# Patient Record
Sex: Female | Born: 2005 | Race: Black or African American | Hispanic: No | Marital: Single | Smoking: Never smoker
Health system: Southern US, Community
[De-identification: ages and names within clinical notes are randomized; demographics above are authoritative.]

## PROBLEM LIST (undated history)

## (undated) ENCOUNTER — Emergency Department (HOSPITAL_BASED_OUTPATIENT_CLINIC_OR_DEPARTMENT_OTHER): Admission: EM | Payer: Managed Care, Other (non HMO) | Source: Home / Self Care

## (undated) DIAGNOSIS — Z8619 Personal history of other infectious and parasitic diseases: Secondary | ICD-10-CM

## (undated) DIAGNOSIS — E301 Precocious puberty: Secondary | ICD-10-CM

## (undated) DIAGNOSIS — J45909 Unspecified asthma, uncomplicated: Secondary | ICD-10-CM

## (undated) HISTORY — DX: Precocious puberty: E30.1

## (undated) HISTORY — DX: Personal history of other infectious and parasitic diseases: Z86.19

## (undated) HISTORY — PX: TONSILECTOMY, ADENOIDECTOMY, BILATERAL MYRINGOTOMY AND TUBES: SHX2538

## (undated) HISTORY — DX: Unspecified asthma, uncomplicated: J45.909

## (undated) HISTORY — PX: SUPPRELIN IMPLANT: SHX5166

---
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
## 2023-06-28 ENCOUNTER — Encounter: Payer: 59 | Admitting: Family

## 2023-06-28 NOTE — Progress Notes (Signed)
 Erroneous encounter-disregard

## 2023-08-14 ENCOUNTER — Ambulatory Visit (INDEPENDENT_AMBULATORY_CARE_PROVIDER_SITE_OTHER): Admitting: Family

## 2023-08-14 ENCOUNTER — Encounter: Payer: Self-pay | Admitting: Family

## 2023-08-14 VITALS — BP 116/71 | HR 72 | Temp 98.1°F | Resp 16 | Ht 65.0 in | Wt 174.0 lb

## 2023-08-14 DIAGNOSIS — Z131 Encounter for screening for diabetes mellitus: Secondary | ICD-10-CM | POA: Diagnosis not present

## 2023-08-14 DIAGNOSIS — Z0000 Encounter for general adult medical examination without abnormal findings: Secondary | ICD-10-CM

## 2023-08-14 DIAGNOSIS — Z13228 Encounter for screening for other metabolic disorders: Secondary | ICD-10-CM

## 2023-08-14 DIAGNOSIS — Z130 Encounter for screening for diseases of the blood and blood-forming organs and certain disorders involving the immune mechanism: Secondary | ICD-10-CM | POA: Diagnosis not present

## 2023-08-14 DIAGNOSIS — Z1329 Encounter for screening for other suspected endocrine disorder: Secondary | ICD-10-CM

## 2023-08-14 DIAGNOSIS — Z13220 Encounter for screening for lipoid disorders: Secondary | ICD-10-CM

## 2023-08-14 DIAGNOSIS — Z1321 Encounter for screening for nutritional disorder: Secondary | ICD-10-CM

## 2023-08-14 NOTE — Progress Notes (Signed)
 Patient wants her vitamin D level check and her iron, and protein levels checked

## 2023-08-14 NOTE — Progress Notes (Signed)
 Patient ID: Lynn Cook, female    DOB: 29-Mar-2006  MRN: [id number]  CC: Annual Exam   Subjective: Lynn Cook is a 18 y.o. female who presents for annual exam.   Her concerns today include:  - Requests Vitamin D and iron labs. States she donates plasma and proteins are always low. She would like to have protein lab checked. States she doesn't know exactly which protein is low. Patient to schedule lab only appointment to have protein lab collected when she finds out which protein is low per plasma center.  Patient Active Problem List   Diagnosis Date Noted   Vitamin D deficiency 02/25/2021   Neck fullness 06/10/2014   Obesity 06/10/2014   Localized swelling, mass or lump of neck 06/10/2014   Dyspepsia 04/22/2014   Prediabetes 04/22/2014   Dermatitis 04/22/2014   Borderline hypertension 12/30/2013   Precocious puberty 12/26/2012   Advanced bone age 66/20/2014   Acanthosis nigricans 12/26/2012   Sleep concern 12/26/2012   Other specified disorders of bone density and structure, unspecified site 12/26/2012     Current Outpatient Medications on File Prior to Visit  Medication Sig Dispense Refill   ELDERBERRY PO Take by mouth. (Patient not taking: Reported on 08/14/2023)     MELATONIN PO Take by mouth. (Patient not taking: Reported on 08/14/2023)     naproxen (NAPROSYN) 375 MG tablet Take 1 tablet (375 mg total) by mouth 2 (two) times daily. 20 tablet 0   No current facility-administered medications on file prior to visit.    No Known Allergies  Social History   Socioeconomic History   Marital status: Single    Spouse name: Not on file   Number of children: Not on file   Years of education: Not on file   Highest education level: Not on file  Occupational History   Not on file  Tobacco Use   Smoking status: Never    Passive exposure: Never   Smokeless tobacco: Never  Vaping Use   Vaping status: Never Used  Substance and Sexual Activity   Alcohol use: No   Drug  use: Never   Sexual activity: Not Currently  Other Topics Concern   Not on file  Social History Narrative   Lives with mom,aunt,sister.    Social Drivers of Corporate investment banker Strain: Low Risk  (08/14/2023)   Overall Financial Resource Strain (CARDIA)    Difficulty of Paying Living Expenses: Not hard at all  Food Insecurity: No Food Insecurity (08/14/2023)   Hunger Vital Sign    Worried About Running Out of Food in the Last Year: Never true    Ran Out of Food in the Last Year: Never true  Transportation Needs: No Transportation Needs (08/14/2023)   PRAPARE - Administrator, Civil Service (Medical): No    Lack of Transportation (Non-Medical): No  Physical Activity: Insufficiently Active (08/14/2023)   Exercise Vital Sign    Days of Exercise per Week: 3 days    Minutes of Exercise per Session: 30 min  Stress: No Stress Concern Present (08/14/2023)   [employer] of Occupational Health - Occupational Stress Questionnaire    Feeling of Stress : Not at all  Social Connections: Moderately Integrated (08/14/2023)   Social Connection and Isolation Panel [NHANES]    Frequency of Communication with Friends and Family: More than three times a week    Frequency of Social Gatherings with Friends and Family: More than three times a week  Attends Religious Services: More than 4 times per year    Active Member of Clubs or Organizations: Yes    Attends Banker Meetings: More than 4 times per year    Marital Status: Never married  Intimate Partner Violence: Not At Risk (08/14/2023)   Humiliation, Afraid, Rape, and Kick questionnaire    Fear of Current or Ex-Partner: No    Emotionally Abused: No    Physically Abused: No    Sexually Abused: No    Family History  Problem Relation Age of Onset   Obesity Mother    Hypertension Mother    Diabetes Mother    Polycystic ovary syndrome Mother    Kidney disease Father    Hypertension Paternal Grandfather     Past  Surgical History:  Procedure Laterality Date   SUPPRELIN IMPLANT     02/26/2010, 03/25/11, 10/2012   TONSILECTOMY, ADENOIDECTOMY, BILATERAL MYRINGOTOMY AND TUBES     age 1    ROS: Review of Systems Negative except as stated above  PHYSICAL EXAM: BP 116/71   Pulse 72   Temp 98.1 F (36.7 C) (Oral)   Resp 16   Ht 5\' 5"  (1.651 m)   Wt 174 lb (78.9 kg)   LMP 08/04/2023 (Exact Date)   SpO2 100%   BMI 28.96 kg/m   Physical Exam HENT:     Head: Normocephalic and atraumatic.     Right Ear: Tympanic membrane, ear canal and external ear normal.     Left Ear: Tympanic membrane, ear canal and external ear normal.     Nose: Nose normal.     Mouth/Throat:     Mouth: Mucous membranes are moist.     Pharynx: Oropharynx is clear.  Eyes:     Extraocular Movements: Extraocular movements intact.     Conjunctiva/sclera: Conjunctivae normal.     Pupils: Pupils are equal, round, and reactive to light.  Neck:     Thyroid: No thyroid mass, thyromegaly or thyroid tenderness.  Cardiovascular:     Rate and Rhythm: Normal rate and regular rhythm.     Pulses: Normal pulses.     Heart sounds: Normal heart sounds.  Pulmonary:     Effort: Pulmonary effort is normal.     Breath sounds: Normal breath sounds.  Chest:     Comments: Patient declined. Abdominal:     General: Bowel sounds are normal.     Palpations: Abdomen is soft.  Genitourinary:    Comments: Patient declined. Musculoskeletal:        General: Normal range of motion.     Right shoulder: Normal.     Left shoulder: Normal.     Right upper arm: Normal.     Left upper arm: Normal.     Right elbow: Normal.     Left elbow: Normal.     Right forearm: Normal.     Left forearm: Normal.     Right wrist: Normal.     Left wrist: Normal.     Right hand: Normal.     Left hand: Normal.     Cervical back: Normal, normal range of motion and neck supple.     Thoracic back: Normal.     Lumbar back: Normal.     Right hip: Normal.      Left hip: Normal.     Right upper leg: Normal.     Left upper leg: Normal.     Right knee: Normal.     Left knee: Normal.  Right lower leg: Normal.     Left lower leg: Normal.     Right ankle: Normal.     Left ankle: Normal.     Right foot: Normal.     Left foot: Normal.  Skin:    General: Skin is warm and dry.     Capillary Refill: Capillary refill takes less than 2 seconds.  Neurological:     General: No focal deficit present.     Mental Status: She is alert and oriented to person, place, and time.  Psychiatric:        Mood and Affect: Mood normal.        Behavior: Behavior normal.     ASSESSMENT AND PLAN: 1. Annual physical exam (Primary) - Counseled on 150 minutes of exercise per week as tolerated, healthy eating (including decreased daily intake of saturated fats, cholesterol, added sugars, sodium), STI prevention, and routine healthcare maintenance.  2. Screening for metabolic disorder - Routine screening.  - CMP14+EGFR  3. Screening for deficiency anemia - Routine screening.  - CBC - Iron, TIBC and Ferritin Panel  4. Diabetes mellitus screening - Routine screening.  - Hemoglobin A1c  5. Screening cholesterol level - Routine screening.  - Lipid panel  6. Thyroid disorder screen - Routine screening.  - TSH  7. Encounter for vitamin deficiency screening - Routine screening.  - Vitamin D, 25-hydroxy    Patient was given the opportunity to ask questions.  Patient verbalized understanding of the plan and was able to repeat key elements of the plan. Patient was given clear instructions to go to Emergency Department or return to medical center if symptoms don't improve, worsen, or new problems develop.The patient verbalized understanding.   Orders Placed This Encounter  Procedures   CBC   Lipid panel   CMP14+EGFR   Hemoglobin A1c   TSH   Vitamin D, 25-hydroxy   Iron, TIBC and Ferritin Panel    Return in about 1 year (around 08/13/2024) for Physical  per patient preference.  Rema Fendt, NP

## 2023-08-15 ENCOUNTER — Encounter: Payer: Self-pay | Admitting: Family

## 2023-08-15 ENCOUNTER — Other Ambulatory Visit: Payer: Self-pay | Admitting: Family

## 2023-08-15 DIAGNOSIS — D509 Iron deficiency anemia, unspecified: Secondary | ICD-10-CM

## 2023-08-15 DIAGNOSIS — E611 Iron deficiency: Secondary | ICD-10-CM

## 2023-08-15 DIAGNOSIS — E559 Vitamin D deficiency, unspecified: Secondary | ICD-10-CM

## 2023-08-15 DIAGNOSIS — Z13220 Encounter for screening for lipoid disorders: Secondary | ICD-10-CM

## 2023-08-15 LAB — CMP14+EGFR
ALT: 22 IU/L (ref 0–32)
AST: 23 IU/L (ref 0–40)
Albumin: 3.9 g/dL — ABNORMAL LOW (ref 4.0–5.0)
Alkaline Phosphatase: 42 IU/L (ref 42–106)
BUN/Creatinine Ratio: 16 (ref 9–23)
BUN: 11 mg/dL (ref 6–20)
Bilirubin Total: 0.4 mg/dL (ref 0.0–1.2)
CO2: 23 mmol/L (ref 20–29)
Calcium: 9.1 mg/dL (ref 8.7–10.2)
Chloride: 103 mmol/L (ref 96–106)
Creatinine, Ser: 0.69 mg/dL (ref 0.57–1.00)
Globulin, Total: 1.8 g/dL (ref 1.5–4.5)
Glucose: 66 mg/dL — ABNORMAL LOW (ref 70–99)
Potassium: 4.5 mmol/L (ref 3.5–5.2)
Sodium: 143 mmol/L (ref 134–144)
Total Protein: 5.7 g/dL — ABNORMAL LOW (ref 6.0–8.5)
eGFR: 128 mL/min/{1.73_m2} (ref >59)

## 2023-08-15 LAB — IRON,TIBC AND FERRITIN PANEL
Ferritin: 14 ng/mL — ABNORMAL LOW (ref 15–77)
Iron Saturation: 24 % (ref 15–55)
Iron: 75 ug/dL (ref 27–159)
Total Iron Binding Capacity: 309 ug/dL (ref 250–450)
UIBC: 234 ug/dL (ref 131–425)

## 2023-08-15 LAB — CBC
Hematocrit: 37.1 % (ref 34.0–46.6)
Hemoglobin: 11.6 g/dL (ref 11.1–15.9)
MCH: 24.4 pg — ABNORMAL LOW (ref 26.6–33.0)
MCHC: 31.3 g/dL — ABNORMAL LOW (ref 31.5–35.7)
MCV: 78 fL — ABNORMAL LOW (ref 79–97)
Platelets: 327 10*3/uL (ref 150–450)
RBC: 4.75 x10E6/uL (ref 3.77–5.28)
RDW: 15.7 % — ABNORMAL HIGH (ref 11.7–15.4)
WBC: 3.5 10*3/uL (ref 3.4–10.8)

## 2023-08-15 LAB — LIPID PANEL
Chol/HDL Ratio: 3.9 ratio (ref 0.0–4.4)
Cholesterol, Total: 212 mg/dL — ABNORMAL HIGH (ref 100–169)
HDL: 55 mg/dL (ref >39)
LDL Chol Calc (NIH): 144 mg/dL — ABNORMAL HIGH (ref 0–109)
Triglycerides: 73 mg/dL (ref 0–89)
VLDL Cholesterol Cal: 13 mg/dL (ref 5–40)

## 2023-08-15 LAB — HEMOGLOBIN A1C
Est. average glucose Bld gHb Est-mCnc: 97 mg/dL
Hgb A1c MFr Bld: 5 % (ref 4.8–5.6)

## 2023-08-15 LAB — TSH: TSH: 1.29 u[IU]/mL (ref 0.450–4.500)

## 2023-08-15 LAB — VITAMIN D 25 HYDROXY (VIT D DEFICIENCY, FRACTURES): Vit D, 25-Hydroxy: 23.9 ng/mL — ABNORMAL LOW (ref 30.0–100.0)

## 2023-08-15 MED ORDER — VITAMIN D (ERGOCALCIFEROL) 1.25 MG (50000 UNIT) PO CAPS: 50000.0000 [IU] | ORAL_CAPSULE | ORAL | 0 refills | Status: AC

## 2023-08-28 ENCOUNTER — Telehealth: Payer: Self-pay

## 2023-08-28 NOTE — Telephone Encounter (Signed)
 Copied from CRM [id number]. Topic: Referral - Status >> Aug 24, 2023  2:07 PM Lotus [place] B wrote: Reason for CRM: at last appointment pt was told that she would receive a call Referral to Hematology Oncology for evaluation/management of anemia and low iron and has yet to receive a call .     Resolved: Called patient and voicemail was left. Also information sent through mychart as well

## 2023-09-26 ENCOUNTER — Inpatient Hospital Stay: Attending: Hematology | Admitting: Hematology

## 2023-09-26 ENCOUNTER — Encounter: Payer: Self-pay | Admitting: Hematology

## 2023-09-26 ENCOUNTER — Inpatient Hospital Stay

## 2023-09-26 VITALS — BP 128/72 | HR 79 | Temp 98.1°F | Resp 20 | Ht 65.0 in | Wt 176.1 lb

## 2023-09-26 DIAGNOSIS — D500 Iron deficiency anemia secondary to blood loss (chronic): Secondary | ICD-10-CM

## 2023-09-26 DIAGNOSIS — D509 Iron deficiency anemia, unspecified: Secondary | ICD-10-CM | POA: Insufficient documentation

## 2023-09-26 NOTE — Progress Notes (Signed)
 [facility]   [phone] [phone]) [phone]   Clinic New Consult Note   Patient Care Team: Senaida Dama, NP as PCP - General (Nurse Practitioner) 09/26/2023  CHIEF COMPLAINTS/PURPOSE OF CONSULTATION:  Iron deficient anemia  REFERRING PHYSICIAN: Senaida Dama, NP   Discussed the use of AI scribe software for clinical note transcription with the patient, who gave verbal consent to proceed.  History of Present Illness Lynn Cook is a 18 year old female who presents for a new consult regarding iron deficient anemia. She is accompanied by her mother. She was referred by her primary care physician for evaluation of iron deficiency anemia.  She has iron deficiency anemia identified through routine blood tests on 08/14/2023 , with hemoglobin at 11.6 g/dL, low MCV, and ferritin at 14 ng/mL. Her menstrual cycles are regular, lasting five to seven days with heavy flow initially, requiring sanitary product changes every two to three hours. She does not take iron supplements but uses multivitamins.  There is no history of anemia in childhood, and she denies symptoms such as blood in stool, urine, unusual bleeding, shortness of breath, or fatigue with physical activity. 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  Family history includes an older sister with low white blood cell counts in childhood, but no anemia, sickle cell disease, or thalassemia. She is a Consulting civil engineer, does not smoke or drink alcohol, and has no history of pregnancy or children.     MEDICAL HISTORY:  Past Medical History:  Diagnosis Date   Allergy-induced asthma    History of RSV infection    Precocious puberty    diagnosis made at age 89     SURGICAL HISTORY: Past Surgical History:  Procedure Laterality Date   SUPPRELIN IMPLANT     02/26/2010, 03/25/11, 10/2012   TONSILECTOMY,  ADENOIDECTOMY, BILATERAL MYRINGOTOMY AND TUBES     age 39    SOCIAL HISTORY: Social History   Socioeconomic History   Marital status: Single    Spouse name: Not on file   Number of children: 0   Years of education: Not on file   Highest education level: Not on file  Occupational History   Not on file  Tobacco Use   Smoking status: Never    Passive exposure: Never   Smokeless tobacco: Never  Vaping Use   Vaping status: Never Used  Substance and Sexual Activity   Alcohol use: No   Drug use: Never   Sexual activity: Not Currently  Other Topics Concern   Not on file  Social History Narrative   Lives with mom,aunt,sister.    Social Drivers of Corporate investment banker Strain: Low Risk  (08/14/2023)   Overall Financial Resource Strain (CARDIA)    Difficulty of Paying Living Expenses: Not hard at all  Food Insecurity: No Food Insecurity (09/26/2023)   Hunger Vital Sign    Worried About Running Out of Food in the Last Year: Never true    Ran Out of Food in the Last Year: Never true  Transportation Needs: No Transportation Needs (09/26/2023)   PRAPARE - Administrator, Civil Service (Medical): No    Lack of Transportation (Non-Medical): No  Physical Activity: Insufficiently Active (08/14/2023)   Exercise Vital Sign    Days of Exercise per Week: 3 days    Minutes of Exercise per Session: 30 min  Stress: No Stress Concern Present (08/14/2023)   [place]  [employer] - Occupational Stress Questionnaire    Feeling of Stress : Not at all  Social Connections: Moderately Integrated (08/14/2023)   Social Connection and Isolation Panel [NHANES]    Frequency of Communication with Friends and Family: More than three times a week    Frequency of Social Gatherings with Friends and Family: More than three times a week    Attends Religious Services: More than 4 times per year    Active Member of [employer] or Organizations: Yes    Attends Engineer, structural:  More than 4 times per year    Marital Status: Never married  Intimate Partner Violence: Not At Risk (09/26/2023)   Humiliation, Afraid, Rape, and Kick questionnaire    Fear of Current or Ex-Partner: No    Emotionally Abused: No    Physically Abused: No    Sexually Abused: No    FAMILY HISTORY: Family History  Problem Relation Age of Onset   Obesity Mother    Hypertension Mother    Diabetes Mother    Polycystic ovary syndrome Mother    Kidney disease Father    Hypertension Paternal Grandfather     ALLERGIES:  has no known allergies.  MEDICATIONS:  Current Outpatient Medications  Medication Sig Dispense Refill   magnesium (MAGTAB) 84 MG ( ) TBCR SR tablet Take 84 mg by mouth.     Multiple Vitamin (MULTIVITAMIN) tablet Take 1 tablet by mouth daily.     Vitamin D , Ergocalciferol , (DRISDOL ) 1.25 MG (50000 UNIT) CAPS capsule Take 1 capsule (50,000 Units total) by mouth every 7 (seven) days for 12 doses. 12 capsule 0   ELDERBERRY PO Take by mouth. (Patient not taking: Reported on 09/26/2023)     MELATONIN PO Take by mouth. (Patient not taking: Reported on 09/26/2023)     naproxen  (NAPROSYN ) 375 MG tablet Take 1 tablet (375 mg total) by mouth 2 (two) times daily. 20 tablet 0   No current facility-administered medications for this visit.    REVIEW OF SYSTEMS:   Constitutional: Denies fevers, chills or abnormal night sweats Eyes: Denies blurriness of vision, double vision or watery eyes Ears, nose, mouth, throat, and face: Denies mucositis or sore throat Respiratory: Denies cough, dyspnea or wheezes Cardiovascular: Denies palpitation, chest discomfort or lower extremity swelling Gastrointestinal:  Denies nausea, heartburn or change in bowel habits Skin: Denies abnormal skin rashes Lymphatics: Denies new lymphadenopathy or easy bruising Neurological:Denies numbness, tingling or new weaknesses Behavioral/Psych: Mood is stable, no new changes  All other systems were reviewed with  the patient and are negative.  PHYSICAL EXAMINATION: ECOG PERFORMANCE STATUS: 0 - Asymptomatic  Vitals:   09/26/23 1457  BP: 128/72  Pulse: 79  Resp: 20  Temp: 98.1 F (36.7 C)  SpO2: 98%   Filed Weights   09/26/23 1457  Weight: 176 lb 1.6 oz (79.9 kg)    GENERAL:alert, no distress and comfortable SKIN: skin color, texture, turgor are normal, no rashes or significant lesions EYES: normal, conjunctiva are pink and non-injected, sclera clear OROPHARYNX:no exudate, no erythema and lips, buccal mucosa, and tongue normal  NECK: supple, thyroid  normal size, non-tender, without nodularity LYMPH:  no palpable lymphadenopathy in the cervical, axillary or inguinal LUNGS: clear to auscultation and percussion with normal breathing effort HEART: regular rate & rhythm and no murmurs and no lower extremity edema ABDOMEN:abdomen soft, non-tender and normal bowel sounds Musculoskeletal:no cyanosis of digits and no clubbing  PSYCH: alert & oriented x 3 with fluent speech NEURO: no focal  motor/sensory deficits  Physical Exam    LABORATORY DATA:  I have reviewed the data as listed    Latest Ref Rng & Units 08/14/2023   10:19 AM 06/27/2022    9:03 PM 02/24/2021    4:59 PM  CBC  WBC 3.4 - 10.8 x10E3/uL 3.5  4.8  3.9   Hemoglobin 11.1 - 15.9 g/dL 16.1  09.6  04.5   Hematocrit 34.0 - 46.6 % 37.1  35.9  35.7   Platelets 150 - 450 x10E3/uL 327  291  252       Latest Ref Rng & Units 08/14/2023   10:19 AM 06/27/2022    9:03 PM 02/24/2021    4:59 PM  CMP  Glucose 70 - 99 mg/dL 66  82  88   BUN 6 - 20 mg/dL 11  14  13    Creatinine 0.57 - 1.00 mg/dL 4.09  8.11  9.14   Sodium 134 - 144 mmol/L 143  140  140   Potassium 3.5 - 5.2 mmol/L 4.5  4.4  4.2   Chloride 96 - 106 mmol/L 103  103  105   CO2 20 - 29 mmol/L 23  22  23    Calcium 8.7 - 10.2 mg/dL 9.1  9.5  9.4   Total Protein 6.0 - 8.5 g/dL 5.7  6.6  6.5   Total Bilirubin 0.0 - 1.2 mg/dL 0.4  0.3  0.2   Alkaline Phos 42 - 106 IU/L 42  73   54   AST 0 - 40 IU/L 23  24  15    ALT 0 - 32 IU/L 22  32  11      RADIOGRAPHIC STUDIES: I have personally reviewed the radiological images as listed and agreed with the findings in the report. No results found.   Assessment & Plan Iron deficiency anemia Mild iron deficiency anemia likely secondary to menstrual blood loss. Hemoglobin is 11.6 g/dL with a low MCV of 75. Ferritin is 14 ng/mL. No significant symptoms or family history of anemia.  Thalassemia is on the differential but if she has no family history.  -Oral iron supplementation is recommended as first-line treatment. Potential side effects include gastrointestinal upset and constipation, which may necessitate a change in iron formulation if severe. IV iron is considered if oral iron is not tolerated or if symptoms worsen. - Recommend over-the-counter ferrous sulfate 65 mg once or twice daily. - Advise taking iron with vitamin C or orange juice to enhance absorption. - Increase dietary intake of iron-rich foods, particularly red meat, chicken, and fish. - Schedule follow-up lab work in three months to reassess iron levels. - Consider IV iron if oral iron is not tolerated or if symptoms worsen. - Plan follow-up visit in six months after lab results to determine further management.  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 Treatment was successful, and normal menstruation began six months after cessation of therapy. No current issues related to precocious puberty.  Plan - Labs reviewed, mild iron deficiency - I recommend her to start oral ferrous sulfate once or twice daily - Follow-up CBC and ferritin every 3 months -phone visit in 6 months after lab   Orders Placed This Encounter  Procedures   CBC with Differential/Platelet    Standing Status:   Standing    Number of Occurrences:   50    Expiration Date:   09/25/2024   Ferritin    Standing Status:   Standing  Number of Occurrences:   20    Expiration Date:   09/25/2024    All questions were answered. The patient knows to call the clinic with any problems, questions or concerns. I spent 25 minutes counseling the patient face to face. The total time spent in the appointment was 30 minutes including review of chart and various tests results, discussions about plan of care and coordination of care plan.     Sonja [place], MD 09/26/2023

## 2023-12-29 ENCOUNTER — Inpatient Hospital Stay: Attending: Hematology

## 2023-12-29 DIAGNOSIS — D509 Iron deficiency anemia, unspecified: Secondary | ICD-10-CM | POA: Insufficient documentation

## 2023-12-29 DIAGNOSIS — D500 Iron deficiency anemia secondary to blood loss (chronic): Secondary | ICD-10-CM

## 2023-12-29 LAB — CBC WITH DIFFERENTIAL/PLATELET
Abs Immature Granulocytes: 0.01 K/uL (ref 0.00–0.07)
Basophils Absolute: 0 K/uL (ref 0.0–0.1)
Basophils Relative: 1 %
Eosinophils Absolute: 0.1 K/uL (ref 0.0–0.5)
Eosinophils Relative: 1 %
HCT: 35.4 % — ABNORMAL LOW (ref 36.0–46.0)
Hemoglobin: 11.2 g/dL — ABNORMAL LOW (ref 12.0–15.0)
Immature Granulocytes: 0 %
Lymphocytes Relative: 43 %
Lymphs Abs: 1.5 K/uL (ref 0.7–4.0)
MCH: 23.6 pg — ABNORMAL LOW (ref 26.0–34.0)
MCHC: 31.6 g/dL (ref 30.0–36.0)
MCV: 74.5 fL — ABNORMAL LOW (ref 80.0–100.0)
Monocytes Absolute: 0.3 K/uL (ref 0.1–1.0)
Monocytes Relative: 9 %
Neutro Abs: 1.6 K/uL — ABNORMAL LOW (ref 1.7–7.7)
Neutrophils Relative %: 46 %
Platelets: 286 K/uL (ref 150–400)
RBC: 4.75 MIL/uL (ref 3.87–5.11)
RDW: 14.9 % (ref 11.5–15.5)
WBC: 3.5 K/uL — ABNORMAL LOW (ref 4.0–10.5)
nRBC: 0 % (ref 0.0–0.2)

## 2023-12-29 LAB — FERRITIN: Ferritin: 19 ng/mL (ref 11–307)

## 2024-01-04 ENCOUNTER — Ambulatory Visit: Payer: Self-pay | Admitting: Nurse Practitioner

## 2024-01-05 NOTE — Telephone Encounter (Addendum)
 Called patient to relay message below as per Lacie Burton NP, patient voiced full understanding, she is taking one a day and is tolerating well, instruted patient to take 2 tablets a day an take with Orange Juice. She voiced full understanding and instructed her to call us  if she is having any issues.    ----- Message from Lacie K Burton sent at 01/04/2024 10:42 AM EDT ----- Please call pt to confirm if she is taking oral iron, how much, and assess tolerance. If she is taking 1/day and tolerating, please have her increase to 2 tabs daily with vit C source. If she is  taking but not tolerating, please arrange phone f/up with me to discuss IV Iron.   Thanks Lacie NP ----- Message ----- From: Rebecka, [facility] [place] Sent: 12/29/2023  11:59 AM EDT To: Onita Mattock, MD

## 2024-03-29 ENCOUNTER — Inpatient Hospital Stay: Attending: Hematology

## 2024-03-29 DIAGNOSIS — D500 Iron deficiency anemia secondary to blood loss (chronic): Secondary | ICD-10-CM | POA: Insufficient documentation

## 2024-03-29 DIAGNOSIS — N920 Excessive and frequent menstruation with regular cycle: Secondary | ICD-10-CM | POA: Diagnosis not present

## 2024-03-29 LAB — CBC WITH DIFFERENTIAL/PLATELET
Abs Immature Granulocytes: 0.01 K/uL (ref 0.00–0.07)
Basophils Absolute: 0 K/uL (ref 0.0–0.1)
Basophils Relative: 1 %
Eosinophils Absolute: 0.1 K/uL (ref 0.0–0.5)
Eosinophils Relative: 2 %
HCT: 37.3 % (ref 36.0–46.0)
Hemoglobin: 12.2 g/dL (ref 12.0–15.0)
Immature Granulocytes: 0 %
Lymphocytes Relative: 40 %
Lymphs Abs: 1.5 K/uL (ref 0.7–4.0)
MCH: 24 pg — ABNORMAL LOW (ref 26.0–34.0)
MCHC: 32.7 g/dL (ref 30.0–36.0)
MCV: 73.4 fL — ABNORMAL LOW (ref 80.0–100.0)
Monocytes Absolute: 0.2 K/uL (ref 0.1–1.0)
Monocytes Relative: 7 %
Neutro Abs: 1.9 K/uL (ref 1.7–7.7)
Neutrophils Relative %: 50 %
Platelets: 237 K/uL (ref 150–400)
RBC: 5.08 MIL/uL (ref 3.87–5.11)
RDW: 14.7 % (ref 11.5–15.5)
WBC: 3.7 K/uL — ABNORMAL LOW (ref 4.0–10.5)
nRBC: 0 % (ref 0.0–0.2)

## 2024-03-29 LAB — FERRITIN: Ferritin: 27 ng/mL (ref 11–307)

## 2024-04-08 ENCOUNTER — Inpatient Hospital Stay: Attending: Hematology | Admitting: Hematology

## 2024-04-08 DIAGNOSIS — D500 Iron deficiency anemia secondary to blood loss (chronic): Secondary | ICD-10-CM | POA: Insufficient documentation

## 2024-04-08 DIAGNOSIS — N920 Excessive and frequent menstruation with regular cycle: Secondary | ICD-10-CM | POA: Insufficient documentation

## 2024-04-08 MED ORDER — FERROUS SULFATE 325 (65 FE) MG PO TBEC: 325.0000 mg | DELAYED_RELEASE_TABLET | Freq: Two times a day (BID) | ORAL | 5 refills | Status: AC

## 2024-04-08 NOTE — Progress Notes (Signed)
 [facility]   [phone] Fax:(336) [phone]   Clinic Follow up Note   Patient Care Team: Jaycee Greig PARAS, NP as PCP - General (Nurse Practitioner) 04/08/2024  I connected with Lynn Cook on 04/08/24 at  8:45 AM EST by telephone and verified that I am speaking with the correct person using two identifiers.   I discussed the limitations, risks, security and privacy concerns of performing an evaluation and management service by telephone and the availability of in person appointments. I also discussed with the patient that there may be a patient responsible charge related to this service. The patient expressed understanding and agreed to proceed.   Patient's location:  Home  Provider's location:  Office    CHIEF COMPLAINT: f/u IDA Assessment & Plan Iron deficiency anemia secondary to heavy menstrual bleeding Iron deficiency anemia is well-managed with improved iron levels and no current anemia. Heavy menstrual bleeding contributes to iron loss. She has not been taking oral iron due to lack of refill but has no issues with constipation when taking it. - Prescribed ferric sulfate to be taken twice daily. - Advised continuation of oral iron supplementation to prevent recurrence of anemia. - Scheduled follow-up lab test in six months. - Scheduled follow-up appointment in one year.  Plan - Lab reviewed, she is clinically doing well - I called in ferrous sulfate twice daily for her - Lab every 6 months, follow-up in 1 year - I encouraged her to find a primary care physician.   Discussed the use of AI scribe software for clinical note transcription with the patient, who gave verbal consent to proceed.  History of Present Illness Lynn Cook is a 18 year old female with iron deficiency anemia who presents for follow-up.  Her menstrual periods are described as pretty heavy. She was prescribed oral ferric sulfate twice daily but has not taken it consistently  because she did not obtain a refill, though she tolerated it without constipation. She has been taking vitamin D  as recommended. Recent labs show improvement in her iron levels.     REVIEW OF SYSTEMS:   Constitutional: Denies fevers, chills or abnormal weight loss Eyes: Denies blurriness of vision Ears, nose, mouth, throat, and face: Denies mucositis or sore throat Respiratory: Denies cough, dyspnea or wheezes Cardiovascular: Denies palpitation, chest discomfort or lower extremity swelling Gastrointestinal:  Denies nausea, heartburn or change in bowel habits Skin: Denies abnormal skin rashes Lymphatics: Denies new lymphadenopathy or easy bruising Neurological:Denies numbness, tingling or new weaknesses Behavioral/Psych: Mood is stable, no new changes  All other systems were reviewed with the patient and are negative.  MEDICAL HISTORY:  Past Medical History:  Diagnosis Date   Allergy-induced asthma    History of RSV infection    Precocious puberty    diagnosis made at age 51     SURGICAL HISTORY: Past Surgical History:  Procedure Laterality Date   SUPPRELIN IMPLANT     02/26/2010, 03/25/11, 10/2012   TONSILECTOMY, ADENOIDECTOMY, BILATERAL MYRINGOTOMY AND TUBES     age 73    I have reviewed the social history and family history with the patient and they are unchanged from previous note.  ALLERGIES:  has no known allergies.  MEDICATIONS:  Current Outpatient Medications  Medication Sig Dispense Refill   ferrous sulfate 325 (65 FE) MG EC tablet Take 1 tablet (325 mg total) by mouth in the morning and at bedtime. 60 tablet 5   ELDERBERRY PO Take by mouth. (Patient not taking: Reported on  09/26/2023)     magnesium (MAGTAB) 84 MG ( ) TBCR SR tablet Take 84 mg by mouth.     MELATONIN PO Take by mouth. (Patient not taking: Reported on 09/26/2023)     Multiple Vitamin (MULTIVITAMIN) tablet Take 1 tablet by mouth daily.     naproxen  (NAPROSYN ) 375 MG tablet Take 1 tablet (375 mg  total) by mouth 2 (two) times daily. 20 tablet 0   No current facility-administered medications for this visit.    PHYSICAL EXAMINATION: Not performed   LABORATORY DATA:  I have reviewed the data as listed    Latest Ref Rng & Units 03/29/2024   10:41 AM 12/29/2023   11:46 AM 08/14/2023   10:19 AM  CBC  WBC 4.0 - 10.5 K/uL 3.7  3.5  3.5   Hemoglobin 12.0 - 15.0 g/dL 87.7  88.7  88.3   Hematocrit 36.0 - 46.0 % 37.3  35.4  37.1   Platelets 150 - 400 K/uL 237  286  327         Latest Ref Rng & Units 08/14/2023   10:19 AM 06/27/2022    9:03 PM 02/24/2021    4:59 PM  CMP  Glucose 70 - 99 mg/dL 66  82  88   BUN 6 - 20 mg/dL 11  14  13    Creatinine 0.57 - 1.00 mg/dL 9.30  9.11  9.33   Sodium 134 - 144 mmol/L 143  140  140   Potassium 3.5 - 5.2 mmol/L 4.5  4.4  4.2   Chloride 96 - 106 mmol/L 103  103  105   CO2 20 - 29 mmol/L 23  22  23    Calcium 8.7 - 10.2 mg/dL 9.1  9.5  9.4   Total Protein 6.0 - 8.5 g/dL 5.7  6.6  6.5   Total Bilirubin 0.0 - 1.2 mg/dL 0.4  0.3  0.2   Alkaline Phos 42 - 106 IU/L 42  73  54   AST 0 - 40 IU/L 23  24  15    ALT 0 - 32 IU/L 22  32  11       RADIOGRAPHIC STUDIES: I have personally reviewed the radiological images as listed and agreed with the findings in the report. No results found.     I discussed the assessment and treatment plan with the patient. The patient was provided an opportunity to ask questions and all were answered. The patient agreed with the plan and demonstrated an understanding of the instructions.   The patient was advised to call back or seek an in-person evaluation if the symptoms worsen or if the condition fails to improve as anticipated.  I provided 10 minutes of non face-to-face telephone visit time during this encounter, including review of chart and various tests results, discussions about plan of care and coordination of care plan.    Onita Mattock, MD 04/08/24

## 2024-05-15 ENCOUNTER — Ambulatory Visit: Payer: Self-pay

## 2024-05-15 NOTE — Telephone Encounter (Signed)
 Noted

## 2024-05-15 NOTE — Telephone Encounter (Signed)
 FYI Only or Action Required?: FYI only for provider: Referred to UC/mobile bus.  Patient was last seen in primary care on 08/14/2023 by Lynn Greig PARAS, NP.  Called Nurse Triage reporting Finger Injury.  Symptoms began several weeks ago.  Interventions attempted: Nothing.  Symptoms are: unchanged.  Triage Disposition: Home Care  Patient/caregiver understands and will follow disposition?: Yes         Copied from CRM [id number]. Topic: Clinical - Red Word Triage >> May 15, 2024 12:30 PM Lynn Cook wrote: Red Word that prompted transfer to Nurse Triage: Pt injured finger two weeks ago playing basketball. Finger is now swollen. Transferring to NT Reason for Disposition  Jammed finger  Answer Assessment - Initial Assessment Questions Patient called in to triage with complaints of right thumb injury. This has been ongoing for 2 weeks.  The patient stated she was playing basketball and jammed it.  For home care, the patient is not taking any OTC medication.   Appointment offered for further evaluation per patient request. However the available times do not work for her. Pt. Referred to UC and the mobile buss.  Patient agrees with the plan of care, and will reach out if symptoms worsen or persist.     1. MECHANISM: How did the injury happen?      Playing basket ball    2. ONSET: When did the injury happen? (e.g., minutes, hours ago)       X 2 weeks ago   3. LOCATION: What part of the finger is injured? Is the nail damaged?      Right thumb    4. APPEARANCE of the INJURY: What does the injury look like?      Swelling noted, no deformity noted   5. SEVERITY: Can you use the hand normally?  Can you bend your fingers into a ball and then fully open them?     Yes she is able to use the thumb    6. SIZE: For cuts, bruises, or swelling, ask: How large is it? (e.g., inches or centimeters;  entire finger)      Swelling noted    7. PAIN: Is there pain? If Yes,  ask: How bad is the pain?  (Scale 0-10; or none, mild, moderate, severe)     No pain currently, only with use- mild pain noted     9. OTHER SYMPTOMS: Do you have any other symptoms?     No    10. PREGNANCY: Is there any chance you are pregnant? When was your last menstrual period?       No  Protocols used: Finger Injury-A-AH

## 2024-05-16 ENCOUNTER — Ambulatory Visit: Payer: Self-pay

## 2024-05-16 NOTE — Telephone Encounter (Signed)
 Patient spoke to NT yesterday, advised UC or Mobile bus, patient denied scheduling as no appointments worked for her. No new or worsening symptoms. PAS retained call and continued to schedule patient.   Copied from CRM [id number]. Topic: Appointments - Appointment Scheduling >> May 16, 2024 12:02 PM Lynn Cook wrote: Patient/patient representative is calling to schedule an appointment. Refer to attachments for appointment information.  Patient was playing basketball and jammed right thumb and has swelling as well as pain

## 2024-08-13 ENCOUNTER — Encounter: Admitting: Family

## 2024-10-07 ENCOUNTER — Inpatient Hospital Stay

## 2025-04-08 ENCOUNTER — Inpatient Hospital Stay

## 2025-04-08 ENCOUNTER — Inpatient Hospital Stay: Admitting: Hematology
